# Patient Record
Sex: Female | Born: 2000 | Race: Black or African American | Hispanic: No | Marital: Single | Smoking: Current some day smoker
Health system: Southern US, Community
[De-identification: ages and names within clinical notes are randomized; demographics above are authoritative.]

---
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
## 2024-05-12 ENCOUNTER — Ambulatory Visit: Payer: Self-pay | Admitting: Family Medicine

## 2024-08-08 ENCOUNTER — Ambulatory Visit: Payer: Self-pay
